# Patient Record
Sex: Female | Born: 1960 | Race: White | Hispanic: No | Marital: Married | Smoking: Never smoker
Health system: Southern US, Community
[De-identification: ages and names within clinical notes are randomized; demographics above are authoritative.]

## PROBLEM LIST (undated history)

## (undated) DIAGNOSIS — F32A Depression, unspecified: Secondary | ICD-10-CM

## (undated) DIAGNOSIS — J45909 Unspecified asthma, uncomplicated: Secondary | ICD-10-CM

## (undated) DIAGNOSIS — N189 Chronic kidney disease, unspecified: Secondary | ICD-10-CM

## (undated) DIAGNOSIS — F419 Anxiety disorder, unspecified: Secondary | ICD-10-CM

## (undated) DIAGNOSIS — T4145XA Adverse effect of unspecified anesthetic, initial encounter: Secondary | ICD-10-CM

## (undated) DIAGNOSIS — T8859XA Other complications of anesthesia, initial encounter: Secondary | ICD-10-CM

## (undated) DIAGNOSIS — K219 Gastro-esophageal reflux disease without esophagitis: Secondary | ICD-10-CM

## (undated) DIAGNOSIS — I10 Essential (primary) hypertension: Secondary | ICD-10-CM

## (undated) DIAGNOSIS — Z973 Presence of spectacles and contact lenses: Secondary | ICD-10-CM

## (undated) DIAGNOSIS — F329 Major depressive disorder, single episode, unspecified: Secondary | ICD-10-CM

## (undated) HISTORY — PX: WISDOM TOOTH EXTRACTION: SHX21

## (undated) HISTORY — DX: Chronic kidney disease, unspecified: N18.9

## (undated) HISTORY — DX: Essential (primary) hypertension: I10

## (undated) HISTORY — DX: Unspecified asthma, uncomplicated: J45.909

---

## 1993-04-30 HISTORY — PX: VAGINAL HYSTERECTOMY: SUR661

## 1999-07-20 ENCOUNTER — Encounter: Payer: Self-pay | Admitting: Otolaryngology

## 1999-07-20 ENCOUNTER — Encounter: Admission: RE | Admit: 1999-07-20 | Discharge: 1999-07-20 | Payer: Self-pay | Admitting: Otolaryngology

## 2002-02-03 ENCOUNTER — Encounter: Admission: RE | Admit: 2002-02-03 | Discharge: 2002-02-03 | Payer: Self-pay | Admitting: Family Medicine

## 2002-02-03 ENCOUNTER — Encounter: Payer: Self-pay | Admitting: Family Medicine

## 2013-09-24 ENCOUNTER — Other Ambulatory Visit: Payer: Self-pay

## 2013-09-24 DIAGNOSIS — Z1231 Encounter for screening mammogram for malignant neoplasm of breast: Secondary | ICD-10-CM

## 2013-10-01 ENCOUNTER — Ambulatory Visit
Admission: RE | Admit: 2013-10-01 | Discharge: 2013-10-01 | Disposition: A | Discharge status: Home / Self Care | Payer: No Typology Code available for payment source | Source: Ambulatory Visit

## 2013-10-01 ENCOUNTER — Encounter (INDEPENDENT_AMBULATORY_CARE_PROVIDER_SITE_OTHER): Payer: Self-pay

## 2013-10-01 DIAGNOSIS — Z1231 Encounter for screening mammogram for malignant neoplasm of breast: Secondary | ICD-10-CM

## 2013-10-05 ENCOUNTER — Other Ambulatory Visit: Payer: Self-pay | Admitting: Family Medicine

## 2013-10-05 DIAGNOSIS — R928 Other abnormal and inconclusive findings on diagnostic imaging of breast: Secondary | ICD-10-CM

## 2013-10-07 ENCOUNTER — Ambulatory Visit
Admission: RE | Admit: 2013-10-07 | Discharge: 2013-10-07 | Disposition: A | Discharge status: Home / Self Care | Payer: No Typology Code available for payment source | Source: Ambulatory Visit | Attending: Family Medicine | Admitting: Family Medicine

## 2013-10-07 ENCOUNTER — Other Ambulatory Visit: Payer: Self-pay | Admitting: Family Medicine

## 2013-10-07 DIAGNOSIS — R921 Mammographic calcification found on diagnostic imaging of breast: Secondary | ICD-10-CM

## 2013-10-07 DIAGNOSIS — R928 Other abnormal and inconclusive findings on diagnostic imaging of breast: Secondary | ICD-10-CM

## 2013-10-15 ENCOUNTER — Ambulatory Visit
Admission: RE | Admit: 2013-10-15 | Discharge: 2013-10-15 | Disposition: A | Discharge status: Home / Self Care | Payer: No Typology Code available for payment source | Source: Ambulatory Visit | Attending: Family Medicine | Admitting: Family Medicine

## 2013-10-26 ENCOUNTER — Ambulatory Visit (INDEPENDENT_AMBULATORY_CARE_PROVIDER_SITE_OTHER): Payer: No Typology Code available for payment source | Admitting: General Surgery

## 2013-10-26 ENCOUNTER — Encounter (INDEPENDENT_AMBULATORY_CARE_PROVIDER_SITE_OTHER): Payer: Self-pay | Admitting: General Surgery

## 2013-10-26 VITALS — BP 128/86 | HR 66 | Temp 97.0°F | Ht 66.0 in | Wt 228.0 lb

## 2013-10-26 DIAGNOSIS — N6089 Other benign mammary dysplasias of unspecified breast: Secondary | ICD-10-CM

## 2013-10-26 DIAGNOSIS — N6091 Unspecified benign mammary dysplasia of right breast: Secondary | ICD-10-CM | POA: Insufficient documentation

## 2013-10-26 NOTE — Progress Notes (Signed)
Patient ID: Linda Davidson, female   DOB: July 15, 1960, 53 y.o.   MRN: [id number]  Chief Complaint  Patient presents with  . eval ADH    HPI Linda Davidson is a 53 y.o. female.  We are asked to see the patient in consultation by Dr. Radford Pax to evaluate her for right breast atypical duct hyperplasia. The patient is a 53 year old white female who recently went for a routine screening mammogram. At that time she was found to have a 4 mm area of microcalcification in the lateral right breast. She denies any breast pain. She denies any discharge from her nipple. This was biopsied and came back as atypical duct hyperplasia.  HPI  Past Medical History  Diagnosis Date  . Asthma   . Hypertension   . Chronic kidney disease     Past Surgical History  Procedure Laterality Date  . Abdominal hysterectomy      Family History  Problem Relation Age of Onset  . Diabetes Mother   . Heart disease Mother   . Heart disease Father     Social History History  Substance Use Topics  . Smoking status: Never Smoker   . Smokeless tobacco: Not on file  . Alcohol Use: Yes    Allergies  Allergen Reactions  . Augmentin [Amoxicillin-Pot Clavulanate]     Current Outpatient Prescriptions  Medication Sig Dispense Refill  . ABILIFY 15 MG tablet       . atenolol (TENORMIN) 25 MG tablet       . buPROPion (WELLBUTRIN XL) 300 MG 24 hr tablet       . fluticasone (FLONASE) 50 MCG/ACT nasal spray       . hydrochlorothiazide (HYDRODIURIL) 25 MG tablet       . omeprazole (PRILOSEC) 20 MG capsule       . potassium chloride (MICRO-K) 10 MEQ CR capsule       . valsartan (DIOVAN) 320 MG tablet       . VIIBRYD 20 MG TABS        No current facility-administered medications for this visit.    Review of Systems Review of Systems  Constitutional: Negative.   HENT: Negative.   Eyes: Negative.   Respiratory: Negative.   Cardiovascular: Negative.   Gastrointestinal: Negative.   Endocrine: Negative.   Genitourinary:  Negative.   Musculoskeletal: Negative.   Skin: Negative.   Allergic/Immunologic: Negative.   Neurological: Negative.   Hematological: Negative.   Psychiatric/Behavioral: Negative.     Blood pressure 128/86, pulse 66, temperature 97 F (36.1 C), height 5\' 6"  (1.676 m), weight 228 lb (103.42 kg).  Physical Exam Physical Exam  Constitutional: She is oriented to person, place, and time. She appears well-developed and well-nourished.  HENT:  Head: Normocephalic and atraumatic.  Eyes: Conjunctivae and EOM are normal. Pupils are equal, round, and reactive to light.  Neck: Normal range of motion. Neck supple.  Cardiovascular: Normal rate, regular rhythm and normal heart sounds.   Pulmonary/Chest: Effort normal and breath sounds normal.  There is no palpable mass in either breast. There is no palpable axillary, supraclavicular, or cervical lymphadenopathy  Abdominal: Soft. Bowel sounds are normal.  Musculoskeletal: Normal range of motion.  Lymphadenopathy:    She has no cervical adenopathy.  Neurological: She is alert and oriented to person, place, and time.  Skin: Skin is warm and dry.  Psychiatric: She has a normal mood and affect. Her behavior is normal.    Data Reviewed As above  Assessment  The patient appears to have a small area of atypical duct hyperplasia in the lateral right breast. Because this is considered a high risk lesion and because it has an appearance very similar to ductal carcinoma in situ at think she would benefit from having this area removed. I've discussed with her in detail the risks and benefits of the operation as well as some of the technical aspects and she understands and wishes to proceed     Plan    Plan for right breast radioactive seed localized lumpectomy. I've also talked her about this being a high-risk lesion and offered to send her to a high risk clinic at one of the academic centers. She is going to think about this.        TOTH  III,PAUL S 10/26/2013, 9:13 AM

## 2013-10-26 NOTE — Patient Instructions (Signed)
Plan for right breast lumpectomy

## 2013-10-27 ENCOUNTER — Other Ambulatory Visit (INDEPENDENT_AMBULATORY_CARE_PROVIDER_SITE_OTHER): Payer: Self-pay | Admitting: General Surgery

## 2013-10-27 DIAGNOSIS — N6091 Unspecified benign mammary dysplasia of right breast: Secondary | ICD-10-CM

## 2013-11-20 ENCOUNTER — Encounter (HOSPITAL_BASED_OUTPATIENT_CLINIC_OR_DEPARTMENT_OTHER): Payer: Self-pay | Admitting: *Deleted

## 2013-11-20 NOTE — Progress Notes (Signed)
To come in for [id number] 

## 2013-11-20 NOTE — Progress Notes (Signed)
11/20/13 1016  OBSTRUCTIVE SLEEP APNEA  Have you ever been diagnosed with sleep apnea through a sleep study? No  Do you snore loudly (loud enough to be heard through closed doors)?  1  Do you often feel tired, fatigued, or sleepy during the daytime? 0  Has anyone observed you stop breathing during your sleep? 0  Do you have, or are you being treated for high blood pressure? 1  BMI more than 35 kg/m2? 1  Age over 53 years old? 1  Neck circumference greater than 40 cm/16 inches? 1  Gender: 0  Obstructive Sleep Apnea Score 5

## 2013-11-23 ENCOUNTER — Encounter (HOSPITAL_BASED_OUTPATIENT_CLINIC_OR_DEPARTMENT_OTHER): Payer: Self-pay | Admitting: *Deleted

## 2013-11-26 ENCOUNTER — Encounter (INDEPENDENT_AMBULATORY_CARE_PROVIDER_SITE_OTHER): Payer: Self-pay

## 2013-11-26 ENCOUNTER — Ambulatory Visit
Admission: RE | Admit: 2013-11-26 | Discharge: 2013-11-26 | Disposition: A | Discharge status: Home / Self Care | Payer: No Typology Code available for payment source | Source: Ambulatory Visit | Attending: General Surgery | Admitting: General Surgery

## 2013-11-26 ENCOUNTER — Encounter (HOSPITAL_BASED_OUTPATIENT_CLINIC_OR_DEPARTMENT_OTHER)
Admission: RE | Admit: 2013-11-26 | Discharge: 2013-11-26 | Disposition: A | Discharge status: Home / Self Care | Payer: No Typology Code available for payment source | Source: Ambulatory Visit | Attending: General Surgery | Admitting: General Surgery

## 2013-11-26 DIAGNOSIS — N6091 Unspecified benign mammary dysplasia of right breast: Secondary | ICD-10-CM

## 2013-11-26 LAB — BASIC METABOLIC PANEL
Anion gap: 11 (ref 5–15)
BUN: 23 mg/dL (ref 6–23)
CALCIUM: 9.7 mg/dL (ref 8.4–10.5)
CO2: 31 mEq/L (ref 19–32)
Chloride: 104 mEq/L (ref 96–112)
Creatinine, Ser: 1.24 mg/dL — ABNORMAL HIGH (ref 0.50–1.10)
GFR, EST AFRICAN AMERICAN: 56 mL/min — AB (ref >90)
GFR, EST NON AFRICAN AMERICAN: 49 mL/min — AB (ref >90)
GLUCOSE: 96 mg/dL (ref 70–99)
POTASSIUM: 4.2 meq/L (ref 3.7–5.3)
SODIUM: 146 meq/L (ref 137–147)

## 2013-11-27 ENCOUNTER — Encounter (HOSPITAL_BASED_OUTPATIENT_CLINIC_OR_DEPARTMENT_OTHER)
Admission: RE | Disposition: A | Discharge status: Home / Self Care | Payer: Self-pay | Source: Ambulatory Visit | Attending: General Surgery

## 2013-11-27 ENCOUNTER — Encounter (HOSPITAL_BASED_OUTPATIENT_CLINIC_OR_DEPARTMENT_OTHER): Payer: Self-pay | Admitting: *Deleted

## 2013-11-27 ENCOUNTER — Ambulatory Visit (HOSPITAL_BASED_OUTPATIENT_CLINIC_OR_DEPARTMENT_OTHER): Payer: No Typology Code available for payment source | Admitting: Certified Registered"

## 2013-11-27 ENCOUNTER — Ambulatory Visit
Admission: RE | Admit: 2013-11-27 | Discharge: 2013-11-27 | Disposition: A | Discharge status: Home / Self Care | Payer: No Typology Code available for payment source | Source: Ambulatory Visit | Attending: General Surgery | Admitting: General Surgery

## 2013-11-27 ENCOUNTER — Encounter (HOSPITAL_BASED_OUTPATIENT_CLINIC_OR_DEPARTMENT_OTHER): Payer: No Typology Code available for payment source | Admitting: Certified Registered"

## 2013-11-27 ENCOUNTER — Ambulatory Visit (HOSPITAL_BASED_OUTPATIENT_CLINIC_OR_DEPARTMENT_OTHER)
Admission: RE | Admit: 2013-11-27 | Discharge: 2013-11-27 | Disposition: A | Discharge status: Home / Self Care | Payer: No Typology Code available for payment source | Source: Ambulatory Visit | Attending: General Surgery | Admitting: General Surgery

## 2013-11-27 DIAGNOSIS — I129 Hypertensive chronic kidney disease with stage 1 through stage 4 chronic kidney disease, or unspecified chronic kidney disease: Secondary | ICD-10-CM | POA: Insufficient documentation

## 2013-11-27 DIAGNOSIS — F3289 Other specified depressive episodes: Secondary | ICD-10-CM | POA: Insufficient documentation

## 2013-11-27 DIAGNOSIS — N6091 Unspecified benign mammary dysplasia of right breast: Secondary | ICD-10-CM

## 2013-11-27 DIAGNOSIS — Z90710 Acquired absence of both cervix and uterus: Secondary | ICD-10-CM | POA: Insufficient documentation

## 2013-11-27 DIAGNOSIS — K219 Gastro-esophageal reflux disease without esophagitis: Secondary | ICD-10-CM | POA: Insufficient documentation

## 2013-11-27 DIAGNOSIS — N6089 Other benign mammary dysplasias of unspecified breast: Secondary | ICD-10-CM | POA: Insufficient documentation

## 2013-11-27 DIAGNOSIS — R920 Mammographic microcalcification found on diagnostic imaging of breast: Secondary | ICD-10-CM

## 2013-11-27 DIAGNOSIS — F411 Generalized anxiety disorder: Secondary | ICD-10-CM | POA: Insufficient documentation

## 2013-11-27 DIAGNOSIS — N6019 Diffuse cystic mastopathy of unspecified breast: Secondary | ICD-10-CM

## 2013-11-27 DIAGNOSIS — Z01812 Encounter for preprocedural laboratory examination: Secondary | ICD-10-CM | POA: Insufficient documentation

## 2013-11-27 DIAGNOSIS — D4860 Neoplasm of uncertain behavior of unspecified breast: Secondary | ICD-10-CM

## 2013-11-27 DIAGNOSIS — J45909 Unspecified asthma, uncomplicated: Secondary | ICD-10-CM | POA: Insufficient documentation

## 2013-11-27 DIAGNOSIS — F329 Major depressive disorder, single episode, unspecified: Secondary | ICD-10-CM | POA: Insufficient documentation

## 2013-11-27 DIAGNOSIS — N189 Chronic kidney disease, unspecified: Secondary | ICD-10-CM | POA: Insufficient documentation

## 2013-11-27 HISTORY — DX: Anxiety disorder, unspecified: F41.9

## 2013-11-27 HISTORY — DX: Presence of spectacles and contact lenses: Z97.3

## 2013-11-27 HISTORY — DX: Gastro-esophageal reflux disease without esophagitis: K21.9

## 2013-11-27 HISTORY — DX: Adverse effect of unspecified anesthetic, initial encounter: T41.45XA

## 2013-11-27 HISTORY — DX: Major depressive disorder, single episode, unspecified: F32.9

## 2013-11-27 HISTORY — DX: Depression, unspecified: F32.A

## 2013-11-27 HISTORY — DX: Other complications of anesthesia, initial encounter: T88.59XA

## 2013-11-27 HISTORY — PX: BREAST LUMPECTOMY WITH RADIOACTIVE SEED LOCALIZATION: SHX6424

## 2013-11-27 LAB — POCT HEMOGLOBIN-HEMACUE: HEMOGLOBIN: 13.6 g/dL (ref 12.0–15.0)

## 2013-11-27 SURGERY — BREAST LUMPECTOMY WITH RADIOACTIVE SEED LOCALIZATION
Anesthesia: General | Site: Breast | Laterality: Right

## 2013-11-27 MED ORDER — LACTATED RINGERS IV SOLN
INTRAVENOUS | Status: DC
  Administered 2013-11-27: 07:00:00 via INTRAVENOUS

## 2013-11-27 MED ORDER — OXYCODONE HCL 5 MG PO TABS: 5.0000 mg | ORAL_TABLET | Freq: Once | ORAL | Status: DC | PRN

## 2013-11-27 MED ORDER — FENTANYL CITRATE 0.05 MG/ML IJ SOLN: 50.0000 ug | INTRAMUSCULAR | Status: DC | PRN

## 2013-11-27 MED ORDER — ONDANSETRON HCL 4 MG/2ML IJ SOLN
4.0000 mg | Freq: Once | INTRAMUSCULAR | Status: DC | PRN
Start: 2013-11-27 — End: 2013-11-27

## 2013-11-27 MED ORDER — MIDAZOLAM HCL 5 MG/5ML IJ SOLN
INTRAMUSCULAR | Status: DC | PRN
  Administered 2013-11-27: 2 mg via INTRAVENOUS

## 2013-11-27 MED ORDER — VANCOMYCIN HCL IN DEXTROSE 500-5 MG/100ML-% IV SOLN
INTRAVENOUS | Status: AC
  Filled 2013-11-27: qty 100

## 2013-11-27 MED ORDER — VANCOMYCIN HCL IN DEXTROSE 1-5 GM/200ML-% IV SOLN
INTRAVENOUS | Status: AC
  Filled 2013-11-27: qty 200

## 2013-11-27 MED ORDER — EPHEDRINE SULFATE 50 MG/ML IJ SOLN
INTRAMUSCULAR | Status: DC | PRN
  Administered 2013-11-27: 10 mg via INTRAVENOUS

## 2013-11-27 MED ORDER — MIDAZOLAM HCL 2 MG/2ML IJ SOLN
INTRAMUSCULAR | Status: AC
  Filled 2013-11-27: qty 2

## 2013-11-27 MED ORDER — OXYCODONE-ACETAMINOPHEN 5-325 MG PO TABS: 1.0000 | ORAL_TABLET | ORAL | Status: AC | PRN

## 2013-11-27 MED ORDER — MIDAZOLAM HCL 2 MG/2ML IJ SOLN: 1.0000 mg | INTRAMUSCULAR | Status: DC | PRN

## 2013-11-27 MED ORDER — OXYCODONE HCL 5 MG/5ML PO SOLN: 5.0000 mg | Freq: Once | ORAL | Status: DC | PRN

## 2013-11-27 MED ORDER — ONDANSETRON HCL 4 MG/2ML IJ SOLN
INTRAMUSCULAR | Status: DC | PRN
  Administered 2013-11-27: 4 mg via INTRAVENOUS

## 2013-11-27 MED ORDER — CHLORHEXIDINE GLUCONATE 4 % EX LIQD: 1.0000 "application " | Freq: Once | CUTANEOUS | Status: DC

## 2013-11-27 MED ORDER — FENTANYL CITRATE 0.05 MG/ML IJ SOLN
INTRAMUSCULAR | Status: DC | PRN
  Administered 2013-11-27: 50 ug via INTRAVENOUS

## 2013-11-27 MED ORDER — PROPOFOL 10 MG/ML IV EMUL
INTRAVENOUS | Status: AC
  Filled 2013-11-27: qty 50

## 2013-11-27 MED ORDER — BUPIVACAINE HCL (PF) 0.25 % IJ SOLN
INTRAMUSCULAR | Status: DC | PRN
  Administered 2013-11-27: 20 mL

## 2013-11-27 MED ORDER — BUPIVACAINE-EPINEPHRINE (PF) 0.25% -1:200000 IJ SOLN
INTRAMUSCULAR | Status: AC
  Filled 2013-11-27: qty 30

## 2013-11-27 MED ORDER — HYDROMORPHONE HCL PF 1 MG/ML IJ SOLN: 0.2500 mg | INTRAMUSCULAR | Status: DC | PRN

## 2013-11-27 MED ORDER — PROPOFOL 10 MG/ML IV BOLUS
INTRAVENOUS | Status: DC | PRN
  Administered 2013-11-27: 150 mg via INTRAVENOUS

## 2013-11-27 MED ORDER — FENTANYL CITRATE 0.05 MG/ML IJ SOLN
INTRAMUSCULAR | Status: AC
  Filled 2013-11-27: qty 6

## 2013-11-27 MED ORDER — VANCOMYCIN HCL 10 G IV SOLR
1500.0000 mg | INTRAVENOUS | Status: AC
  Administered 2013-11-27: 1000 mg via INTRAVENOUS

## 2013-11-27 MED ORDER — LIDOCAINE HCL (CARDIAC) 20 MG/ML IV SOLN
INTRAVENOUS | Status: DC | PRN
  Administered 2013-11-27: 60 mg via INTRAVENOUS

## 2013-11-27 MED ORDER — DEXAMETHASONE SODIUM PHOSPHATE 4 MG/ML IJ SOLN
INTRAMUSCULAR | Status: DC | PRN
  Administered 2013-11-27: 10 mg via INTRAVENOUS

## 2013-11-27 MED ORDER — BUPIVACAINE HCL (PF) 0.25 % IJ SOLN
INTRAMUSCULAR | Status: AC
  Filled 2013-11-27: qty 30

## 2013-11-27 SURGICAL SUPPLY — 52 items
ADH SKN CLS APL DERMABOND .7 (GAUZE/BANDAGES/DRESSINGS) ×1
APPLIER CLIP 9.375 MED OPEN (MISCELLANEOUS)
APR CLP MED 9.3 20 MLT OPN (MISCELLANEOUS)
BINDER BREAST LRG (GAUZE/BANDAGES/DRESSINGS) IMPLANT
BINDER BREAST MEDIUM (GAUZE/BANDAGES/DRESSINGS) IMPLANT
BINDER BREAST XLRG (GAUZE/BANDAGES/DRESSINGS) IMPLANT
BINDER BREAST XXLRG (GAUZE/BANDAGES/DRESSINGS) IMPLANT
BLADE SURG 15 STRL LF DISP TIS (BLADE) ×1 IMPLANT
BLADE SURG 15 STRL SS (BLADE) ×3
CANISTER SUC SOCK COL 7IN (MISCELLANEOUS) IMPLANT
CANISTER SUCT 1200ML W/VALVE (MISCELLANEOUS) ×3 IMPLANT
CHLORAPREP W/TINT 26ML (MISCELLANEOUS) ×3 IMPLANT
CLIP APPLIE 9.375 MED OPEN (MISCELLANEOUS) IMPLANT
CLIP TI WIDE RED SMALL 6 (CLIP) ×3 IMPLANT
COVER MAYO STAND STRL (DRAPES) ×3 IMPLANT
COVER PROBE W GEL 5X96 (DRAPES) ×3 IMPLANT
COVER TABLE BACK 60X90 (DRAPES) ×3 IMPLANT
DECANTER SPIKE VIAL GLASS SM (MISCELLANEOUS) IMPLANT
DERMABOND ADVANCED (GAUZE/BANDAGES/DRESSINGS) ×2
DERMABOND ADVANCED .7 DNX12 (GAUZE/BANDAGES/DRESSINGS) ×1 IMPLANT
DEVICE DUBIN W/COMP PLATE 8390 (MISCELLANEOUS) ×3 IMPLANT
DRAPE LAPAROSCOPIC ABDOMINAL (DRAPES) ×3 IMPLANT
DRAPE PED LAPAROTOMY (DRAPES) IMPLANT
DRAPE UTILITY XL STRL (DRAPES) ×3 IMPLANT
ELECT COATED BLADE 2.86 ST (ELECTRODE) ×3 IMPLANT
ELECT REM PT RETURN 9FT ADLT (ELECTROSURGICAL) ×3
ELECTRODE REM PT RTRN 9FT ADLT (ELECTROSURGICAL) ×1 IMPLANT
GLOVE BIO SURGEON STRL SZ7.5 (GLOVE) ×3 IMPLANT
GLOVE BIOGEL PI IND STRL 7.0 (GLOVE) ×2 IMPLANT
GLOVE BIOGEL PI INDICATOR 7.0 (GLOVE) ×4
GLOVE ECLIPSE 7.0 STRL STRAW (GLOVE) ×3 IMPLANT
GLOVE EUDERMIC 7 POWDERFREE (GLOVE) ×3 IMPLANT
GOWN STRL REUS W/ TWL LRG LVL3 (GOWN DISPOSABLE) ×2 IMPLANT
GOWN STRL REUS W/ TWL XL LVL3 (GOWN DISPOSABLE) ×1 IMPLANT
GOWN STRL REUS W/TWL LRG LVL3 (GOWN DISPOSABLE) ×4
GOWN STRL REUS W/TWL XL LVL3 (GOWN DISPOSABLE) ×2
KIT MARKER MARGIN INK (KITS) ×3 IMPLANT
NEEDLE HYPO 25X1 1.5 SAFETY (NEEDLE) ×3 IMPLANT
NS IRRIG 1000ML POUR BTL (IV SOLUTION) ×3 IMPLANT
PACK BASIN DAY SURGERY FS (CUSTOM PROCEDURE TRAY) ×3 IMPLANT
PENCIL BUTTON HOLSTER BLD 10FT (ELECTRODE) ×3 IMPLANT
SLEEVE SCD COMPRESS KNEE MED (MISCELLANEOUS) ×3 IMPLANT
SPONGE LAP 4X18 X RAY DECT (DISPOSABLE) ×3 IMPLANT
SUT MON AB 4-0 PC3 18 (SUTURE) ×3 IMPLANT
SUT SILK 2 0 SH (SUTURE) ×3 IMPLANT
SUT VICRYL 3-0 CR8 SH (SUTURE) ×3 IMPLANT
SYRINGE CONTROL L 12CC (SYRINGE) ×3 IMPLANT
TOWEL OR 17X24 6PK STRL BLUE (TOWEL DISPOSABLE) ×3 IMPLANT
TOWEL OR NON WOVEN STRL DISP B (DISPOSABLE) ×3 IMPLANT
TUBE CONNECTING 20'X1/4 (TUBING) ×1
TUBE CONNECTING 20X1/4 (TUBING) ×2 IMPLANT
YANKAUER SUCT BULB TIP NO VENT (SUCTIONS) ×3 IMPLANT

## 2013-11-27 NOTE — Discharge Instructions (Signed)

## 2013-11-27 NOTE — Transfer of Care (Signed)
Immediate Anesthesia Transfer of Care Note  Patient: Linda Davidson  Procedure(s) Performed: Procedure(s): RIGHT BREAST  RADIOACTIVE SEED LOCALIZATION LUMPECTOMY (Right)  Patient Location: PACU  Anesthesia Type:General  Level of Consciousness: awake, alert , oriented and patient cooperative  Airway & Oxygen Therapy: Patient Spontanous Breathing and Patient connected to face mask oxygen  Post-op Assessment: Report given to PACU RN and Post -op Vital signs reviewed and stable  Post vital signs: Reviewed and stable  Complications: No apparent anesthesia complications

## 2013-11-27 NOTE — Interval H&P Note (Signed)
History and Physical Interval Note:  11/27/2013 7:17 AM  Linda Davidson  has presented today for surgery, with the diagnosis of right breast ADH  The various methods of treatment have been discussed with the patient and family. After consideration of risks, benefits and other options for treatment, the patient has consented to  Procedure(s): RIGHT BREAST  RADIOACTIVE SEED LOCALIZATION LUMPECTOMY (Right) as a surgical intervention .  The patient's history has been reviewed, patient examined, no change in status, stable for surgery.  I have reviewed the patient's chart and labs.  Questions were answered to the patient's satisfaction.     TOTH III,PAUL S

## 2013-11-27 NOTE — Anesthesia Postprocedure Evaluation (Signed)
  Anesthesia Post-op Note  Patient: Linda Davidson  Procedure(s) Performed: Procedure(s): RIGHT BREAST  RADIOACTIVE SEED LOCALIZATION LUMPECTOMY (Right)  Patient Location: PACU  Anesthesia Type: General   Level of Consciousness: awake, alert  and oriented  Airway and Oxygen Therapy: Patient Spontanous Breathing  Post-op Pain: moderate  Post-op Assessment: Post-op Vital signs reviewed  Post-op Vital Signs: Reviewed  Last Vitals:  Filed Vitals:   11/27/13 0915  BP: 127/81  Pulse: 85  Temp:   Resp: 15    Complications: No apparent anesthesia complications

## 2013-11-27 NOTE — Anesthesia Preprocedure Evaluation (Addendum)
Anesthesia Evaluation  Patient identified by MRN, date of birth, ID band Patient awake    Reviewed: Allergy & Precautions, NPO status   Airway Mallampati: I TM Distance: >3 FB Neck ROM: Full    Dental  (+) Teeth Intact, Dental Advisory Given   Pulmonary asthma ,  breath sounds clear to auscultation        Cardiovascular hypertension, Pt. on home beta blockers Rhythm:Regular Rate:Normal     Neuro/Psych PSYCHIATRIC DISORDERS Anxiety Depression    GI/Hepatic GERD-  Medicated,  Endo/Other    Renal/GU Renal InsufficiencyRenal disease     Musculoskeletal   Abdominal   Peds  Hematology   Anesthesia Other Findings   Reproductive/Obstetrics                        Anesthesia Physical Anesthesia Plan  ASA: III  Anesthesia Plan: General   Post-op Pain Management:    Induction: Intravenous  Airway Management Planned: LMA  Additional Equipment:   Intra-op Plan:   Post-operative Plan: Extubation in OR  Informed Consent: I have reviewed the patients History and Physical, chart, labs and discussed the procedure including the risks, benefits and alternatives for the proposed anesthesia with the patient or authorized representative who has indicated his/her understanding and acceptance.   Dental advisory given  Plan Discussed with: CRNA, Anesthesiologist and Surgeon  Anesthesia Plan Comments:         Anesthesia Quick Evaluation

## 2013-11-27 NOTE — Op Note (Signed)
11/27/2013  8:24 AM  PATIENT:  Linda Davidson  53 y.o. female  PRE-OPERATIVE DIAGNOSIS:  right breast ADH  POST-OPERATIVE DIAGNOSIS:  right breast atypical ductal hyperplasia  PROCEDURE:  Procedure(s): RIGHT BREAST  RADIOACTIVE SEED LOCALIZATION LUMPECTOMY (Right)  SURGEON:  Surgeon(s) and Role:    * Merrie Roof, MD - Primary    * Adin Hector, MD - Assisting  PHYSICIAN ASSISTANT:   ASSISTANTS: Dr. Dalbert Batman   ANESTHESIA:   general  EBL:     BLOOD ADMINISTERED:none  DRAINS: none   LOCAL MEDICATIONS USED:  MARCAINE     SPECIMEN:  Source of Specimen:  right breast tissue  DISPOSITION OF SPECIMEN:  PATHOLOGY  COUNTS:  YES  TOURNIQUET:  * No tourniquets in log *  DICTATION: .Dragon Dictation After informed consent was obtained the patient was brought to the operating room and placed in the supine position on the operating room table. After adequate induction of general anesthesia the patient's right breast was prepped with ChloraPrep, allowed to dry, and draped in the usual sterile manner. The patient had previously had a radioactive seed placed in the upper outer quadrant of the right breast. Using the I 125 setting on the neoprobe we were able to identify a hot spot in the upper outer quadrant near the edge of the areola. A curvilinear incision was made at the edge of the areola in the upper outer quadrant. This incision was carried through the skin and subcutaneous tissue sharply with the electrocautery. I checked our direction frequently with the neoprobe. Once we were close to the radioactive seed we then excised a circular portion of breast tissue around the area of radioactivity. Once the specimen was removed it was oriented with the appropriate paint colors. The I-125 signal was confirmed in the specimen. There was no radioactivity in the breast. A specimen radiograph showed the clip in seed to both be in the specimen. The specimen was then sent to pathology for further  evaluation. Hemostasis was achieved using the Bovie electrocautery. The wound was infiltrated with quarter percent Marcaine and irrigated with copious amounts of saline. The deep layer the wounds closed with interrupted 3-0 vicryl stitches. The skin was then closed with interrupted 4-0 Monocryl subcuticular stitches. Dermabond dressings were applied. The patient tolerated the procedure well. At the end of the case all needle sponge and instrument counts were correct. The patient was then awakened and taken to recovery in stable condition.  PLAN OF CARE: Discharge to home after PACU  PATIENT DISPOSITION:  PACU - hemodynamically stable.   Delay start of Pharmacological VTE agent (>24hrs) due to surgical blood loss or risk of bleeding: not applicable

## 2013-11-27 NOTE — H&P (Signed)
Linda Davidson  10/26/2013 8:30 AM   Office Visit  MRN:  [id number]   Description: 53 year old female  Provider: Merrie Roof, MD  Department: [facility]         Diagnoses      Atypical ductal hyperplasia of right breast    -  Primary      610.8             Reason for Visit      eval ADH             Current Vitals Most recent update: 10/26/2013  8:40 AM by Vale Haven, CMA      BP Pulse Temp(Src) Ht Wt BMI      128/86 66 97 F (36.1 C) 5\' 6"  (1.676 m) 228 lb (103.42 kg) 36.82 kg/m2             Progress Notes      Merrie Roof, MD at 10/26/2013  9:13 AM      Status: Signed            Patient ID: Cleotis Lema, female   DOB: 06/26/1960, 53 y.o.   MRN: [id number]    Chief Complaint   Patient presents with   .  eval ADH        HPI Linda Davidson is a 53 y.o. female.  We are asked to see the patient in consultation by Dr. Radford Pax to evaluate her for right breast atypical duct hyperplasia. The patient is a 53 year old white female who recently went for a routine screening mammogram. At that time she was found to have a 4 mm area of microcalcification in the lateral right breast. She denies any breast pain. She denies any discharge from her nipple. This was biopsied and came back as atypical duct hyperplasia.  HPI    Past Medical History   Diagnosis  Date   .  Asthma     .  Hypertension     .  Chronic kidney disease           Past Surgical History   Procedure  Laterality  Date   .  Abdominal hysterectomy             Family History   Problem  Relation  Age of Onset   .  Diabetes  Mother     .  Heart disease  Mother     .  Heart disease  Father          Social History History   Substance Use Topics   .  Smoking status:  Never Smoker    .  Smokeless tobacco:  Not on file   .  Alcohol Use:  Yes         Allergies   Allergen  Reactions   .  Augmentin [Amoxicillin-Pot Clavulanate]           Current Outpatient Prescriptions   Medication   Sig  Dispense  Refill   .  ABILIFY 15 MG tablet           .  atenolol (TENORMIN) 25 MG tablet           .  buPROPion (WELLBUTRIN XL) 300 MG 24 hr tablet           .  fluticasone (FLONASE) 50 MCG/ACT nasal spray           .  hydrochlorothiazide (HYDRODIURIL) 25 MG tablet           .  omeprazole (PRILOSEC) 20 MG capsule           .  potassium chloride (MICRO-K) 10 MEQ CR capsule           .  valsartan (DIOVAN) 320 MG tablet           .  VIIBRYD 20 MG TABS               No current facility-administered medications for this visit.        Review of Systems Review of Systems  Constitutional: Negative.   HENT: Negative.   Eyes: Negative.   Respiratory: Negative.   Cardiovascular: Negative.   Gastrointestinal: Negative.   Endocrine: Negative.   Genitourinary: Negative.   Musculoskeletal: Negative.   Skin: Negative.   Allergic/Immunologic: Negative.   Neurological: Negative.   Hematological: Negative.   Psychiatric/Behavioral: Negative.       Blood pressure 128/86, pulse 66, temperature 97 F (36.1 C), height 5\' 6"  (1.676 m), weight 228 lb (103.42 kg).   Physical Exam Physical Exam  Constitutional: She is oriented to person, place, and time. She appears well-developed and well-nourished.  HENT:   Head: Normocephalic and atraumatic.  Eyes: Conjunctivae and EOM are normal. Pupils are equal, round, and reactive to light.  Neck: Normal range of motion. Neck supple.  Cardiovascular: Normal rate, regular rhythm and normal heart sounds.   Pulmonary/Chest: Effort normal and breath sounds normal.  There is no palpable mass in either breast. There is no palpable axillary, supraclavicular, or cervical lymphadenopathy  Abdominal: Soft. Bowel sounds are normal.  Musculoskeletal: Normal range of motion.  Lymphadenopathy:    She has no cervical adenopathy.  Neurological: She is alert and oriented to person, place, and time.  Skin: Skin is warm and dry.  Psychiatric: She has a normal  mood and affect. Her behavior is normal.      Data Reviewed As above   Assessment    The patient appears to have a small area of atypical duct hyperplasia in the lateral right breast. Because this is considered a high risk lesion and because it has an appearance very similar to ductal carcinoma in situ at think she would benefit from having this area removed. I've discussed with her in detail the risks and benefits of the operation as well as some of the technical aspects and she understands and wishes to proceed      Plan    Plan for right breast radioactive seed localized lumpectomy. I've also talked her about this being a high-risk lesion and offered to send her to a high risk clinic at one of the academic centers. She is going to think about this.

## 2013-11-27 NOTE — Anesthesia Procedure Notes (Signed)
Procedure Name: LMA Insertion Date/Time: 11/27/2013 7:34 AM Performed by: Caitlen Worth Pre-anesthesia Checklist: Patient identified, Emergency Drugs available, Suction available and Patient being monitored Patient Re-evaluated:Patient Re-evaluated prior to inductionOxygen Delivery Method: Circle System Utilized Preoxygenation: Pre-oxygenation with 100% oxygen Intubation Type: IV induction Ventilation: Mask ventilation without difficulty LMA: LMA inserted LMA Size: 4.0 Number of attempts: 1 Airway Equipment and Method: bite block Placement Confirmation: positive ETCO2 Tube secured with: Tape Dental Injury: Teeth and Oropharynx as per pre-operative assessment

## 2013-11-30 ENCOUNTER — Encounter (HOSPITAL_BASED_OUTPATIENT_CLINIC_OR_DEPARTMENT_OTHER): Payer: Self-pay | Admitting: General Surgery

## 2013-12-02 ENCOUNTER — Telehealth (INDEPENDENT_AMBULATORY_CARE_PROVIDER_SITE_OTHER): Payer: Self-pay

## 2013-12-02 NOTE — Telephone Encounter (Signed)
Pt requesting pathology results and was told all benign.  Dr. Marlou Starks will discuss with her at her post op appointment.

## 2013-12-15 ENCOUNTER — Telehealth: Payer: Self-pay | Admitting: *Deleted

## 2013-12-15 ENCOUNTER — Ambulatory Visit (INDEPENDENT_AMBULATORY_CARE_PROVIDER_SITE_OTHER): Payer: No Typology Code available for payment source | Admitting: General Surgery

## 2013-12-15 ENCOUNTER — Encounter (INDEPENDENT_AMBULATORY_CARE_PROVIDER_SITE_OTHER): Payer: Self-pay | Admitting: General Surgery

## 2013-12-15 VITALS — BP 124/74 | HR 78 | Temp 97.5°F | Ht 66.0 in | Wt 227.0 lb

## 2013-12-15 DIAGNOSIS — N6091 Unspecified benign mammary dysplasia of right breast: Secondary | ICD-10-CM

## 2013-12-15 DIAGNOSIS — N6089 Other benign mammary dysplasias of unspecified breast: Secondary | ICD-10-CM

## 2013-12-15 NOTE — Telephone Encounter (Signed)
Called pt and confirmed 12/22/13 high risk appt w/ her.  Emailed Sharyn Lull and [place] at [employer] to make them aware.

## 2013-12-15 NOTE — Patient Instructions (Signed)
Will refer to high risk clinic

## 2013-12-15 NOTE — Progress Notes (Signed)
Subjective:     Patient ID: Linda Davidson, female   DOB: 04-28-1961, 53 y.o.   MRN: [id number]  HPI The patient is a 53 year old white female who is about 2 weeks status post right lumpectomy for atypical duct and atypical lobular hyperplasia. She tolerated the surgery well. She denies any breast pain.  Review of Systems     Objective:   Physical Exam On exam her right breast incision is healing nicely with no sign of infection or significant seroma    Assessment:     The patient is 2 weeks status post right lumpectomy for a high-risk lesion     Plan:     At this point she will continue to do regular self exams. She will need to continue to get an annual exam and mammogram. I will also refer her to the high-risk clinic. We will plan to see her back on a when necessary basis

## 2013-12-21 ENCOUNTER — Other Ambulatory Visit: Payer: Self-pay | Admitting: *Deleted

## 2013-12-21 DIAGNOSIS — N6091 Unspecified benign mammary dysplasia of right breast: Secondary | ICD-10-CM

## 2013-12-22 ENCOUNTER — Ambulatory Visit (HOSPITAL_BASED_OUTPATIENT_CLINIC_OR_DEPARTMENT_OTHER): Payer: No Typology Code available for payment source | Admitting: Hematology

## 2013-12-22 ENCOUNTER — Other Ambulatory Visit (HOSPITAL_BASED_OUTPATIENT_CLINIC_OR_DEPARTMENT_OTHER): Payer: No Typology Code available for payment source

## 2013-12-22 VITALS — BP 130/81 | HR 63 | Temp 98.2°F | Resp 18 | Ht 66.0 in | Wt 229.5 lb

## 2013-12-22 DIAGNOSIS — N6089 Other benign mammary dysplasias of unspecified breast: Secondary | ICD-10-CM

## 2013-12-22 DIAGNOSIS — N62 Hypertrophy of breast: Secondary | ICD-10-CM

## 2013-12-22 DIAGNOSIS — N6091 Unspecified benign mammary dysplasia of right breast: Secondary | ICD-10-CM

## 2013-12-22 LAB — COMPREHENSIVE METABOLIC PANEL (CC13)
ALBUMIN: 3.9 g/dL (ref 3.5–5.0)
ALK PHOS: 80 U/L (ref 40–150)
ALT: 17 U/L (ref 0–55)
AST: 19 U/L (ref 5–34)
Anion Gap: 10 mEq/L (ref 3–11)
BILIRUBIN TOTAL: 0.38 mg/dL (ref 0.20–1.20)
BUN: 22.2 mg/dL (ref 7.0–26.0)
CO2: 30 mEq/L — ABNORMAL HIGH (ref 22–29)
Calcium: 9.6 mg/dL (ref 8.4–10.4)
Chloride: 106 mEq/L (ref 98–109)
Creatinine: 1.1 mg/dL (ref 0.6–1.1)
GLUCOSE: 100 mg/dL (ref 70–140)
POTASSIUM: 3.9 meq/L (ref 3.5–5.1)
SODIUM: 146 meq/L — AB (ref 136–145)
TOTAL PROTEIN: 7.2 g/dL (ref 6.4–8.3)

## 2013-12-22 LAB — CBC WITH DIFFERENTIAL/PLATELET
BASO%: 0.3 % (ref 0.0–2.0)
Basophils Absolute: 0 10*3/uL (ref 0.0–0.1)
EOS%: 1.2 % (ref 0.0–7.0)
Eosinophils Absolute: 0.1 10*3/uL (ref 0.0–0.5)
HCT: 42.9 % (ref 34.8–46.6)
HGB: 14.4 g/dL (ref 11.6–15.9)
LYMPH%: 36.8 % (ref 14.0–49.7)
MCH: 30.1 pg (ref 25.1–34.0)
MCHC: 33.5 g/dL (ref 31.5–36.0)
MCV: 89.9 fL (ref 79.5–101.0)
MONO#: 0.4 10*3/uL (ref 0.1–0.9)
MONO%: 7.7 % (ref 0.0–14.0)
NEUT%: 54 % (ref 38.4–76.8)
NEUTROS ABS: 2.6 10*3/uL (ref 1.5–6.5)
Platelets: 246 10*3/uL (ref 145–400)
RBC: 4.77 10*6/uL (ref 3.70–5.45)
RDW: 12.9 % (ref 11.2–14.5)
WBC: 4.7 10*3/uL (ref 3.9–10.3)
lymph#: 1.7 10*3/uL (ref 0.9–3.3)

## 2014-01-05 ENCOUNTER — Encounter: Payer: Self-pay | Admitting: Hematology

## 2014-01-05 NOTE — Progress Notes (Signed)
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 female is here because of recent diagnosis of breast ADH/ALH.  Patient went for a routine screening mammogram and showed an abnormality on 10/01/2013. Diagnostic mammogram 10/07/2013 found a 4 mm area of grouped amorphous calcifications in the lateral right breast (UOQ). She denies any breast pain. She denies any discharge from her nipple. This was biopsied using stereotactic approach and came back as atypical ductal hyperplasia and atypical lobular hyperplasia on 10/15/2013. Patient then had radioactive seed localization prior to breast surgery on 11/26/2013 using I-125 radioactive seeds at the site of clip. Dr Marlou Starks then performed a Lumpectomy on 11/27/2013 which pathology showed Atypical Lobular Hyperplasia. Inked margins negative. There was no invasive cancer found.Patient is now being referred to high risk breast clinic for discussing chemoprevention.  I reviewed her records extensively and collaborated the history with the patient.  In terms of breast cancer risk profile:  She menarched at early age of 53 and went to menopause at age 58 as she had hysterectomy She had 2 pregnancies, her first child was born at age 38  She did not breast-fed her children. She received birth control pills for approximately 16 years and also got Dep shot for fibroids finally needed a hysterectomy She was never exposed to fertility medications or hormone replacement therapy.  She has no family history of Breast/GYN/GI cancer  INTERVAL HISTORY: Currently, she is healing well after surgery apart from mild discomfort. She has no concern for local  infection  MEDICAL HISTORY:  Past Medical History  Diagnosis Date  . Asthma   . Hypertension   . Chronic kidney disease   . Wears glasses   . GERD (gastroesophageal reflux disease)   . Depression   . Anxiety   . Complication of anesthesia     cannot do GAS-hyperventulates    SURGICAL HISTORY: Past Surgical History  Procedure Laterality Date  . Vaginal hysterectomy  1995  . Wisdom tooth extraction    . Breast lumpectomy with radioactive seed localization Right 11/27/2013    Procedure: RIGHT BREAST  RADIOACTIVE SEED LOCALIZATION LUMPECTOMY;  Surgeon: Merrie Roof, MD;  Location: [facility];  Service: General;  Laterality: Right;    SOCIAL HISTORY: History   Social History  . Marital Status: Married    Spouse Name: Braydee Shimkus    Number of Children: 2  . Years of Education: N/A   Occupational History  . 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 Smoking status: Never Smoker   . Smokeless tobacco: Not on file  . Alcohol Use: Yes  . Drug Use: No  . Sexual Activity: Not on file   Other Topics Concern  . Not on file   Social History Narrative   Patient have 2 daughters [place] age 77 who is in [place], [place] and a Landscape architect. Second daughter is Nehemie Casserly age 51 also in [place], [place] and a Teacher, English as a foreign language. No smoking or Alcohol use. Patient suffers from Depression. Patient have one brother age 66 and a sister age 44 both live in [place].    FAMILY  HISTORY: Family History  Problem Relation Age of Onset  . Diabetes Mother   . Heart disease Mother   . Heart disease Father     ALLERGIES:  is allergic to augmentin and lexapro.  MEDICATIONS:  Current Outpatient Prescriptions  Medication Sig Dispense Refill  . ABILIFY 15 MG tablet Take 15 mg by mouth daily.       Marland Kitchen atenolol (TENORMIN) 25 MG tablet Take 25 mg by mouth daily.       Marland Kitchen buPROPion (WELLBUTRIN XL) 300 MG 24 hr tablet Take  300 mg by mouth daily.       . fluticasone (FLONASE) 50 MCG/ACT nasal spray Place 1 spray into both nostrils daily.       . hydrochlorothiazide (HYDRODIURIL) 25 MG tablet Take 25 mg by mouth daily.       Marland Kitchen omeprazole (PRILOSEC) 20 MG capsule Take 20 mg by mouth daily.       Marland Kitchen oxyCODONE-acetaminophen (ROXICET) 5-325 MG per tablet Take 1-2 tablets by mouth every 4 (four) hours as needed.  50 tablet  0  . potassium chloride (MICRO-K) 10 MEQ CR capsule Take 10 mEq by mouth 2 (two) times daily.       . valsartan (DIOVAN) 320 MG tablet Take 320 mg by mouth daily.       Marland Kitchen VIIBRYD 20 MG TABS Take 20 mg by mouth daily.        No current facility-administered medications for this visit.    Review of Systems  Constitutional: Positive for diaphoresis. Negative for fever, chills, weight loss and malaise/fatigue.  HENT: Negative for congestion, ear discharge, ear pain, hearing loss, nosebleeds, sore throat and tinnitus.   Eyes: Negative for blurred vision, double vision, photophobia, pain, discharge and redness.  Respiratory: Negative for cough, hemoptysis, sputum production, shortness of breath, wheezing and stridor.   Cardiovascular: Negative for chest pain, palpitations, orthopnea, claudication, leg swelling and PND.  Gastrointestinal: Negative for heartburn, nausea, vomiting, abdominal pain, diarrhea, constipation, blood in stool and melena.  Genitourinary: Negative for dysuria, urgency, frequency, hematuria and flank pain.  Musculoskeletal: Negative for back pain, falls, joint pain, myalgias and neck pain.  Skin: Negative for itching and rash.  Neurological: Negative for dizziness, tingling, tremors, sensory change, speech change, focal weakness, seizures, loss of consciousness, weakness and headaches.  Endo/Heme/Allergies: Negative for environmental allergies and polydipsia. Does not bruise/bleed easily.  Psychiatric/Behavioral: Positive for depression. Negative for suicidal ideas, hallucinations,  memory loss and substance abuse. The patient is nervous/anxious and has insomnia.      PHYSICAL EXAMINATION: ECOG PERFORMANCE STATUS: 0 KPS 100  Filed Vitals:   12/22/13 1112  BP: 130/81  Pulse: 63  Temp: 98.2 F (36.8 C)  Resp: 18   Filed Weights   12/22/13 1112  Weight: 229 lb 8 oz (104.101 kg)    Physical Exam  Constitutional: She is oriented to person, place, and time. She appears well-developed and well-nourished.  HENT:  Head: Normocephalic and atraumatic.  Right Ear: External ear normal.  Left Ear: External ear normal.  Nose: Nose normal.  Mouth/Throat: Oropharynx is clear and moist.  Eyes: EOM are normal. Pupils are equal, round, and reactive to light.  Neck: Normal range of motion. Neck supple. No JVD present. No tracheal deviation present. No thyromegaly present.  Cardiovascular: Normal rate, regular rhythm, normal heart sounds and intact distal pulses.  Exam reveals no gallop and no friction rub.   No murmur heard. Pulmonary/Chest: Effort normal and breath sounds normal. No respiratory distress.  She has no wheezes. She has no rales. Right breast exhibits no inverted nipple, no mass, no nipple discharge, no skin change and no tenderness. Left breast exhibits no inverted nipple, no mass, no nipple discharge, no skin change and no tenderness. Breasts are symmetrical. There is no breast swelling.  Abdominal: Soft. Bowel sounds are normal. She exhibits no distension and no mass. There is no tenderness.  Genitourinary: No breast bleeding.  Musculoskeletal: Normal range of motion. She exhibits no edema and no tenderness.  Lymphadenopathy:    She has no cervical adenopathy.  Neurological: She is alert and oriented to person, place, and time. She has normal reflexes. No cranial nerve deficit. She exhibits normal muscle tone. Coordination normal.  Skin: Skin is warm. No rash noted.  Psychiatric: She has a normal mood and affect. Her behavior is normal. Judgment and thought  content normal.    LABORATORY DATA:  I have reviewed the data as listed Lab Results  Component Value Date   WBC 4.7 12/22/2013   HGB 14.4 12/22/2013   HCT 42.9 12/22/2013   MCV 89.9 12/22/2013   PLT 246 12/22/2013   Lab Results  Component Value Date   NA 146* 12/22/2013   K 3.9 12/22/2013   CL 104 11/26/2013   CO2 30* 12/22/2013    RADIOGRAPHIC STUDIES: I have personally reviewed the radiological images as listed and agreed with the findings in the report.   ASSESSMENT:   1. 41 postmenopausal female with a diagnosis of ADH and ALH on breast biopsy and a Lumpectomy done on right breast 11/27/2013 showed ALH but no evidence of dysplastic changes (LCIS/DCIS) neither any invasive cancer.  2. Surgical removal of these precursor lesions is the primary therapy. There is no role for radiation therapy or chemotherapy but anti-estrogen drugs like Tamoxifen and Raloxifene are used as Selective estrogen receptor modulators and decrease the risk of invasive cancer by 50%. The absolute reduction is in order of 5-7% if we look at NSABP Prevention trials or SEER data base. With the Aromatase Inhibitors like Aromasin or recently approved Arimidex, the risk reduction is around 60-65%. They prevent the invasive cancer in same breast as well as the opposite breast.  3. These drugs can cause symptoms like hot flashes, vasomotor symptoms, vaginal itching and dryness, mood changes. Arimidex can increase cholesterol and cause HTN and promote bone loss resulting in osteopenia and osteoporosis and also cause arthralgias and myalgias. Tamoxifen can cause cataracts, gall stones, risk for dvt and pulmonary embolism and risk for uterine cancer. It also increase risk for myocardial infarction, TIA and CVA because of its prothrombotic effects. Both drugs can affect quality of life.  4. The other approach is doing surveillance by doing self breast exam monthly, a physician based breast exam every 6 month and a diagnostic  mammogram every year. Patient is agreeable to come here for a breast exam and follow up in 6 months. At that time we will make sure that we scheduled her next mammogram which would be due in June 2016 at breast center.  5. Patient have decided that she will prefer the active surveillance approach and not go on anti-estrogen drugs.  6. Time spent with patient discussing the chemoprevention strategies: 60 minutes. She was given written notes and information regarding her diagnosis and the treatment options.     Bernadene Bell, MD Medical Hematologist/Oncologist [facility] Pager: [phone] Office No: [phone]

## 2014-01-08 ENCOUNTER — Telehealth: Payer: Self-pay | Admitting: Hematology

## 2014-01-08 NOTE — Telephone Encounter (Signed)
s.w. pt and advised on Feb 2016 appt....pt ok adn aware °

## 2014-05-19 ENCOUNTER — Telehealth: Payer: Self-pay | Admitting: Hematology and Oncology

## 2014-05-19 NOTE — Telephone Encounter (Signed)
, °

## 2014-06-07 ENCOUNTER — Other Ambulatory Visit: Payer: Self-pay | Admitting: *Deleted

## 2014-06-07 DIAGNOSIS — N6091 Unspecified benign mammary dysplasia of right breast: Secondary | ICD-10-CM

## 2014-06-08 ENCOUNTER — Ambulatory Visit (HOSPITAL_BASED_OUTPATIENT_CLINIC_OR_DEPARTMENT_OTHER): Payer: BLUE CROSS/BLUE SHIELD | Admitting: Hematology and Oncology

## 2014-06-08 ENCOUNTER — Other Ambulatory Visit (HOSPITAL_BASED_OUTPATIENT_CLINIC_OR_DEPARTMENT_OTHER): Payer: BLUE CROSS/BLUE SHIELD

## 2014-06-08 ENCOUNTER — Other Ambulatory Visit: Payer: No Typology Code available for payment source

## 2014-06-08 ENCOUNTER — Telehealth: Payer: Self-pay | Admitting: Hematology and Oncology

## 2014-06-08 ENCOUNTER — Ambulatory Visit: Payer: No Typology Code available for payment source

## 2014-06-08 VITALS — BP 122/71 | HR 68 | Temp 98.0°F | Resp 19 | Ht 66.0 in | Wt 229.8 lb

## 2014-06-08 DIAGNOSIS — N62 Hypertrophy of breast: Secondary | ICD-10-CM

## 2014-06-08 DIAGNOSIS — N6091 Unspecified benign mammary dysplasia of right breast: Secondary | ICD-10-CM

## 2014-06-08 DIAGNOSIS — Z853 Personal history of malignant neoplasm of breast: Secondary | ICD-10-CM

## 2014-06-08 LAB — CBC WITH DIFFERENTIAL/PLATELET
BASO%: 0.2 % (ref 0.0–2.0)
Basophils Absolute: 0 10*3/uL (ref 0.0–0.1)
EOS%: 1.4 % (ref 0.0–7.0)
Eosinophils Absolute: 0.1 10*3/uL (ref 0.0–0.5)
HEMATOCRIT: 42.7 % (ref 34.8–46.6)
HEMOGLOBIN: 14.6 g/dL (ref 11.6–15.9)
LYMPH#: 1.7 10*3/uL (ref 0.9–3.3)
LYMPH%: 32.1 % (ref 14.0–49.7)
MCH: 30.9 pg (ref 25.1–34.0)
MCHC: 34.2 g/dL (ref 31.5–36.0)
MCV: 90.5 fL (ref 79.5–101.0)
MONO#: 0.5 10*3/uL (ref 0.1–0.9)
MONO%: 8.7 % (ref 0.0–14.0)
NEUT#: 3 10*3/uL (ref 1.5–6.5)
NEUT%: 57.6 % (ref 38.4–76.8)
PLATELETS: 243 10*3/uL (ref 145–400)
RBC: 4.72 10*6/uL (ref 3.70–5.45)
RDW: 12.8 % (ref 11.2–14.5)
WBC: 5.2 10*3/uL (ref 3.9–10.3)

## 2014-06-08 LAB — COMPREHENSIVE METABOLIC PANEL (CC13)
ALBUMIN: 3.8 g/dL (ref 3.5–5.0)
ALT: 19 U/L (ref 0–55)
ANION GAP: 12 meq/L — AB (ref 3–11)
AST: 18 U/L (ref 5–34)
Alkaline Phosphatase: 76 U/L (ref 40–150)
BUN: 20.2 mg/dL (ref 7.0–26.0)
CO2: 26 meq/L (ref 22–29)
Calcium: 9.5 mg/dL (ref 8.4–10.4)
Chloride: 107 mEq/L (ref 98–109)
Creatinine: 1.1 mg/dL (ref 0.6–1.1)
EGFR: 55 mL/min/{1.73_m2} — AB (ref >90)
GLUCOSE: 109 mg/dL (ref 70–140)
Potassium: 4 mEq/L (ref 3.5–5.1)
SODIUM: 145 meq/L (ref 136–145)
TOTAL PROTEIN: 6.9 g/dL (ref 6.4–8.3)
Total Bilirubin: 0.37 mg/dL (ref 0.20–1.20)

## 2014-06-08 NOTE — Progress Notes (Signed)
Patient Care Team: Marjorie Smolder, MD as PCP - General (Family Medicine)  DIAGNOSIS: ADH/ALH  SUMMARY OF ONCOLOGIC HISTORY: 1. Patient went for a routine screening mammogram and showed an abnormality on 10/01/2013. Diagnostic mammogram 10/07/2013 found a 4 mm area of grouped amorphous calcifications in the lateral right breast (UOQ). biopsied using stereotactic approach and came back as atypical ductal hyperplasia and atypical lobular hyperplasia on 10/15/2013.  2. Patient then had radioactive seed localization prior to breast surgery on 11/26/2013 using I-125 radioactive seeds at the site of clip. Dr Marlou Starks then performed a Lumpectomy on 11/27/2013 which pathology showed Atypical Lobular Hyperplasia  CHIEF COMPLIANT:  Follow-up for breast cancer surveillance  INTERVAL HISTORY: Linda Davidson is a  54 year old lady with above-mentioned history of ADH/ALH who is here for six-month follow-up. She was offered antiestrogen therapy with tamoxifen but she decided to go on active surveillance. She denies any new lumps or nodules in the breasts. Otherwise doing well without any new problems.  REVIEW OF SYSTEMS:   Constitutional: Denies fevers, chills or abnormal weight loss Eyes: Denies blurriness of vision Ears, nose, mouth, throat, and face: Denies mucositis or sore throat Respiratory: Denies cough, dyspnea or wheezes Cardiovascular: Denies palpitation, chest discomfort or lower extremity swelling Gastrointestinal:  Denies nausea, heartburn or change in bowel habits Skin: Denies abnormal skin rashes Lymphatics: Denies new lymphadenopathy or easy bruising Neurological:Denies numbness, tingling or new weaknesses Behavioral/Psych: Mood is stable, no new changes  Breast:  denies any pain or lumps or nodules in either breasts All other systems were reviewed with the patient and are negative.  I have reviewed the past medical history, past surgical history, social history and family history with the patient  and they are unchanged from previous note.  ALLERGIES:  is allergic to augmentin and lexapro.  MEDICATIONS:  Current Outpatient Prescriptions  Medication Sig Dispense Refill  . ABILIFY 15 MG tablet Take 15 mg by mouth daily.     Marland Kitchen atenolol (TENORMIN) 25 MG tablet Take 25 mg by mouth daily.     Marland Kitchen buPROPion (WELLBUTRIN XL) 300 MG 24 hr tablet Take 300 mg by mouth daily.     . fluticasone (FLONASE) 50 MCG/ACT nasal spray Place 1 spray into both nostrils daily.     . hydrochlorothiazide (HYDRODIURIL) 25 MG tablet Take 25 mg by mouth daily.     Marland Kitchen omeprazole (PRILOSEC) 20 MG capsule Take 20 mg by mouth daily.     Marland Kitchen oxyCODONE-acetaminophen (ROXICET) 5-325 MG per tablet Take 1-2 tablets by mouth every 4 (four) hours as needed. 50 tablet 0  . potassium chloride (MICRO-K) 10 MEQ CR capsule Take 10 mEq by mouth 2 (two) times daily.     . valsartan (DIOVAN) 320 MG tablet Take 320 mg by mouth daily.     Marland Kitchen VIIBRYD 20 MG TABS Take 20 mg by mouth daily.      No current facility-administered medications for this visit.    PHYSICAL EXAMINATION: ECOG PERFORMANCE STATUS: 0 - Asymptomatic  Filed Vitals:   06/08/14 1122  BP: 122/71  Pulse: 68  Temp: 98 F (36.7 C)  Resp: 19   Filed Weights   06/08/14 1122  Weight: 229 lb 12.8 oz (104.237 kg)    GENERAL:alert, no distress and comfortable SKIN: skin color, texture, turgor are normal, no rashes or significant lesions EYES: normal, Conjunctiva are pink and non-injected, sclera clear OROPHARYNX:no exudate, no erythema and lips, buccal mucosa, and tongue normal  NECK: supple, thyroid normal size, non-tender,  without nodularity LYMPH:  no palpable lymphadenopathy in the cervical, axillary or inguinal LUNGS: clear to auscultation and percussion with normal breathing effort HEART: regular rate & rhythm and no murmurs and no lower extremity edema ABDOMEN:abdomen soft, non-tender and normal bowel sounds Musculoskeletal:no cyanosis of digits and no  clubbing  NEURO: alert & oriented x 3 with fluent speech, no focal motor/sensory deficits BREAST: No palpable masses or nodules in either right or left breasts. No palpable axillary supraclavicular or infraclavicular adenopathy no breast tenderness or nipple discharge. (exam performed in the presence of a chaperone)  LABORATORY DATA:  I have reviewed the data as listed   Chemistry      Component Value Date/Time   NA 145 06/08/2014 1102   NA 146 11/26/2013 1030   K 4.0 06/08/2014 1102   K 4.2 11/26/2013 1030   CL 104 11/26/2013 1030   CO2 26 06/08/2014 1102   CO2 31 11/26/2013 1030   BUN 20.2 06/08/2014 1102   BUN 23 11/26/2013 1030   CREATININE 1.1 06/08/2014 1102   CREATININE 1.24* 11/26/2013 1030      Component Value Date/Time   CALCIUM 9.5 06/08/2014 1102   CALCIUM 9.7 11/26/2013 1030   ALKPHOS 76 06/08/2014 1102   AST 18 06/08/2014 1102   ALT 19 06/08/2014 1102   BILITOT 0.37 06/08/2014 1102       Lab Results  Component Value Date   WBC 5.2 06/08/2014   HGB 14.6 06/08/2014   HCT 42.7 06/08/2014   MCV 90.5 06/08/2014   PLT 243 06/08/2014   NEUTROABS 3.0 06/08/2014   ASSESSMENT & PLAN:  Atypical ductal hyperplasia of right breast ADH and ALH status post lumpectomy right breast 11/27/2013: Patient was offered tamoxifen or raloxifene but she elected to do active surveillance.  Breast cancer surveillance: 1. Mammogram to be done June 2016 2. Breast exam done 06/08/2014 is normal I recommend continuing annual surveillance for breast cancer with mammograms and breast exams once a year. Return to clinic in 1 year for follow-up  No orders of the defined types were placed in this encounter.   The patient has a good understanding of the overall plan. she agrees with it. She will call with any problems that may develop before her next visit here.   Rulon Eisenmenger, MD

## 2014-06-08 NOTE — Telephone Encounter (Signed)
, °

## 2014-06-08 NOTE — Assessment & Plan Note (Signed)
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  Breast cancer surveillance: 1. Mammogram to be done June 2016 2. Breast exam done 06/08/2014 is normal I recommend continuing annual surveillance for breast cancer with mammograms and breast exams once a year. Return to clinic in 1 year for follow-up

## 2014-09-22 ENCOUNTER — Other Ambulatory Visit: Payer: Self-pay

## 2014-09-22 DIAGNOSIS — Z1231 Encounter for screening mammogram for malignant neoplasm of breast: Secondary | ICD-10-CM

## 2014-10-20 ENCOUNTER — Ambulatory Visit
Admission: RE | Admit: 2014-10-20 | Discharge: 2014-10-20 | Disposition: A | Discharge status: Home / Self Care | Payer: BLUE CROSS/BLUE SHIELD | Source: Ambulatory Visit

## 2014-10-20 DIAGNOSIS — Z1231 Encounter for screening mammogram for malignant neoplasm of breast: Secondary | ICD-10-CM

## 2014-10-21 ENCOUNTER — Other Ambulatory Visit: Payer: Self-pay | Admitting: Family Medicine

## 2014-10-21 DIAGNOSIS — R928 Other abnormal and inconclusive findings on diagnostic imaging of breast: Secondary | ICD-10-CM

## 2014-10-27 ENCOUNTER — Ambulatory Visit
Admission: RE | Admit: 2014-10-27 | Discharge: 2014-10-27 | Disposition: A | Discharge status: Home / Self Care | Payer: BLUE CROSS/BLUE SHIELD | Source: Ambulatory Visit | Attending: Family Medicine | Admitting: Family Medicine

## 2014-10-27 DIAGNOSIS — R928 Other abnormal and inconclusive findings on diagnostic imaging of breast: Secondary | ICD-10-CM

## 2015-04-05 ENCOUNTER — Ambulatory Visit (INDEPENDENT_AMBULATORY_CARE_PROVIDER_SITE_OTHER): Payer: BLUE CROSS/BLUE SHIELD | Admitting: Neurology

## 2015-04-05 ENCOUNTER — Encounter: Payer: Self-pay | Admitting: Neurology

## 2015-04-05 VITALS — BP 100/70 | HR 88 | Ht 66.0 in | Wt 235.0 lb

## 2015-04-05 DIAGNOSIS — G2402 Drug induced acute dystonia: Secondary | ICD-10-CM | POA: Diagnosis not present

## 2015-04-05 DIAGNOSIS — G2401 Drug induced subacute dyskinesia: Secondary | ICD-10-CM

## 2015-04-05 DIAGNOSIS — T43505A Adverse effect of unspecified antipsychotics and neuroleptics, initial encounter: Principal | ICD-10-CM

## 2015-04-05 NOTE — Progress Notes (Signed)
u   Linda Davidson was seen today in the movement disorders clinic for neurologic consultation at the request of Marjorie Smolder, MD.    The patient presents neurologic consultation for abnormal movements of the hands and mouth and leg.  Her daughter states that she actually noted the movements in the beginning of November when she moved in with the patient but the patient previously lived alone and she didn't notice them.  Her daughter states that the fingers look fidgety and the tongue will protrude spontaneously.  Her daughter states that if she points out the hand movements or if she uses the hands, it does away.  I have reviewed the records made available to me.  The patient was on Abilify for mood until about one-two months ago.  She states that she was on that for several years.  She states that it was d/c because it was contributing to kidney disease.  She states that Janeth Rase is the NPwho takes care of her depression.   Specific Symptoms:  Voice: no change Sleep: sleeps well  Vivid Dreams:  No.  Acting out dreams:  No. Wet Pillows: Yes.   Postural symptoms:  No.  Falls?  No. Bradykinesia symptoms: difficulty getting out of a chair Loss of smell:  No. Loss of taste:  No. Urinary Incontinence:  No. Difficulty Swallowing:  No. Handwriting, micrographia: No. Trouble with ADL's:  No.  Trouble buttoning clothing: No. Depression:  Yes.   Memory changes:  No. Hallucinations:  No.  visual distortions: No. N/V:  No. Lightheaded:  Yes.    Syncope: No. Diplopia:  No. Dyskinesia:  No.  Neuroimaging has not recently previously been performed (thinks had done years ago).  It is not available for my review today.   ALLERGIES:   Allergies  Allergen Reactions  . Augmentin [Amoxicillin-Pot Clavulanate] Rash  . Lexapro [Escitalopram Oxalate] Rash    CURRENT MEDICATIONS:  Outpatient Encounter Prescriptions as of 04/05/2015  Medication Sig  . atenolol (TENORMIN) 25 MG tablet Take  25 mg by mouth daily.   Marland Kitchen buPROPion (WELLBUTRIN XL) 300 MG 24 hr tablet Take 300 mg by mouth daily.   . fluticasone (FLONASE) 50 MCG/ACT nasal spray Place 1 spray into both nostrils daily.   Marland Kitchen omeprazole (PRILOSEC) 20 MG capsule Take 20 mg by mouth daily.   Marland Kitchen oxyCODONE-acetaminophen (ROXICET) 5-325 MG per tablet Take 1-2 tablets by mouth every 4 (four) hours as needed.  . potassium chloride (MICRO-K) 10 MEQ CR capsule Take 10 mEq by mouth 2 (two) times daily.   . valsartan (DIOVAN) 320 MG tablet Take 320 mg by mouth daily.   Marland Kitchen VIIBRYD 20 MG TABS Take 20 mg by mouth daily.   . [DISCONTINUED] ABILIFY 15 MG tablet Take 15 mg by mouth daily.   . [DISCONTINUED] hydrochlorothiazide (HYDRODIURIL) 25 MG tablet Take 25 mg by mouth daily.    No facility-administered encounter medications on file as of 04/05/2015.    PAST MEDICAL HISTORY:   Past Medical History  Diagnosis Date  . Asthma   . Hypertension   . Chronic kidney disease   . Wears glasses   . GERD (gastroesophageal reflux disease)   . Depression   . Anxiety   . Complication of anesthesia     cannot do GAS-hyperventulates    PAST SURGICAL HISTORY:   Past Surgical History  Procedure Laterality Date  . Vaginal hysterectomy  1995  . Wisdom tooth extraction    . Breast lumpectomy with radioactive seed  localization Right 11/27/2013    Procedure: RIGHT BREAST  RADIOACTIVE SEED LOCALIZATION LUMPECTOMY;  Surgeon: Merrie Roof, MD;  Location: [place];  Service: General;  Laterality: Right;    SOCIAL HISTORY:   Social History   Social History  . Marital Status: Married    Spouse Name: Carreen Petrus  . Number of Children: 2  . Years of Education: N/A   Occupational History  . 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 Smoking status: Never Smoker   . Smokeless tobacco: Not on file  . Alcohol Use: 0.0 oz/week    0 Standard drinks or equivalent per week     Comment: one  drink a week  . Drug Use: No  . Sexual Activity: Not on file   Other Topics Concern  . Not on file   Social History Narrative   Patient have 2 daughters [place] age 34 who is in [place], [place] and a Landscape architect. Second daughter is Jayah Camano age 35 also in [place], [place] and a Teacher, English as a foreign language. No smoking or Alcohol use. Patient suffers from Depression. Patient have one brother age 9 and a sister age 7 both live in [place].    FAMILY HISTORY:   Family Status  Relation Status Death Age  . Mother Deceased     heart disease, DM  . Father Deceased     heart disease  . Sister Alive     HTN  . Brother Alive     HTN    ROS:  A complete 10 system review of systems was obtained and was unremarkable apart from what is mentioned above.  PHYSICAL EXAMINATION:    VITALS:   Filed Vitals:   04/05/15 0856  BP: 100/70  Pulse: 88  Height: 5\' 6"  (1.676 m)  Weight: 235 lb (106.595 kg)    GEN:  The patient appears stated age and is in NAD. HEENT:  Normocephalic, atraumatic.  The mucous membranes are moist. The superficial temporal arteries are without ropiness or tenderness.  The tongue does not protrude outside of the mouth.  She does mild tongue movements within the mouth. CV:  RRR Lungs:  CTAB Neck/HEME:  There are no carotid bruits bilaterally.  Neurological examination:  Orientation: The patient is alert and oriented x3. Fund of knowledge is appropriate.  Recent and remote memory are intact.  Attention and concentration are normal.    Able to name objects and repeat phrases. Cranial nerves: There is good facial symmetry.  There is mild facial hypomimia.  Pupils are equal round and reactive to light bilaterally. Fundoscopic exam reveals clear margins bilaterally. Extraocular muscles are intact. The visual fields are full to confrontational testing. The speech is fluent and clear.  She makes a soft grunting noise periodically.  Soft palate rises symmetrically  and there is no tongue deviation. Hearing is intact to conversational tone. Sensation: Sensation is intact to light and pinprick throughout (facial, trunk, extremities). Vibration is intact at the bilateral big toe although it is decreased. There is no extinction with double simultaneous stimulation. There is no sensory dermatomal level identified. Motor: Strength is 5/5 in the bilateral upper and lower extremities.   Shoulder shrug is equal and symmetric.  There is no pronator drift. Deep tendon reflexes: Deep tendon reflexes are 2-2+/4 at the bilateral biceps, triceps, brachioradialis, patella and achilles. Plantar responses are downgoing bilaterally.  Movement examination: Tone: There  is normal tone in the bilateral upper extremities.  The tone in the lower extremities is normal.  Abnormal movements: There are slight abnormal dyskinetic movements of the second, third and fourth digits on both hands.  As above, she has mild tongue movements within the mouth. Coordination:  There is no decremation with RAM's, with any form of RAMS, including alternating supination and pronation of the forearm, hand opening and closing, finger taps, heel taps and toe taps. Gait and Station: The patient has no significant difficulty arising out of a deep-seated chair without the use of the hands. The patient's stride length is normal.  She is able to ambulate in a tandem fashion.  She is able to stand in Romberg position.  The patient had lab work done on 03/27/2015.  Her hemoglobin A1c was 5.6.  Sodium was 143, potassium 4.1, chloride 106, CO2 27, BUN 29, creatinine 1.18, glucose 75.  AST was 15, ALT 18 and alkaline phosphatase 69.  ASSESSMENT/PLAN:   1.  Tardive dyskinesia  -Long discussion with the patient and her daughter.  Told the patient and her daughter that even after a medication like Abilify is discontinued, tardive dyskinesia can be permanent.  Fortunately, her tardive movements are very mild and  hopefully will resolve over the next 6 months.  It has been my experience that once movements remain for 6 months, they are more likely to be permanent.  Hopefully, hers will dissipate.  We talked about Delcie Roch, but I do not think she is a good candidate for this medication, primarily because of her depression, but also because of the mild nature of her symptoms.  I gave her the opportunity to ask questions and I answered them to the best of my ability.  At this point, I would recommend that she stay away from antipsychotics until she gets resolution of symptoms.  Hopefully, she will.  I also recommended that she stay away from any medication that could exacerbate this, such as Reglan.  We talked about this in detail.  Much greater than 50% of the 45 min visit was spent in counseling.  She asked me to send a copy of this visit to her nurse practitioner, which I am happy to do.  She sign a record release in this regard.  She will follow-up with me on an as-needed basis.

## 2015-04-05 NOTE — Progress Notes (Signed)
Note sent to Darcus Austin and Janeth Rase.

## 2015-05-16 ENCOUNTER — Telehealth: Payer: Self-pay | Admitting: Hematology and Oncology

## 2015-05-16 NOTE — Telephone Encounter (Signed)
Left message for patient regarding appointment change from 2/9 to 2/13. Appointment sent via mail.

## 2015-06-09 ENCOUNTER — Ambulatory Visit: Payer: BLUE CROSS/BLUE SHIELD | Admitting: Hematology and Oncology

## 2015-06-12 NOTE — Assessment & Plan Note (Signed)
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  Breast cancer surveillance: 1. Mammogram to be done June 2016 2. Breast exam done 06/12/15 is normal I recommend continuing annual surveillance for breast cancer with mammograms and breast exams once a year. Return to clinic in 1 year for follow-up

## 2015-06-13 ENCOUNTER — Ambulatory Visit: Payer: BLUE CROSS/BLUE SHIELD | Admitting: Hematology and Oncology

## 2015-11-25 IMAGING — MG MM RT PLC BREAST LOC DEV   1ST LESION  INC MAMMO GUIDE
7 series · 7 of 7 positions shown · non-contrast
Comparison: Previous exam(s)

CLINICAL DATA: Patient with recent diagnosis of right breast
atypical ductal hyperplasia.

EXAM:
MAMMOGRAPHIC GUIDED RADIOACTIVE SEED LOCALIZATION OF THE RIGHT
BREAST

[R ML (1 of 4)]
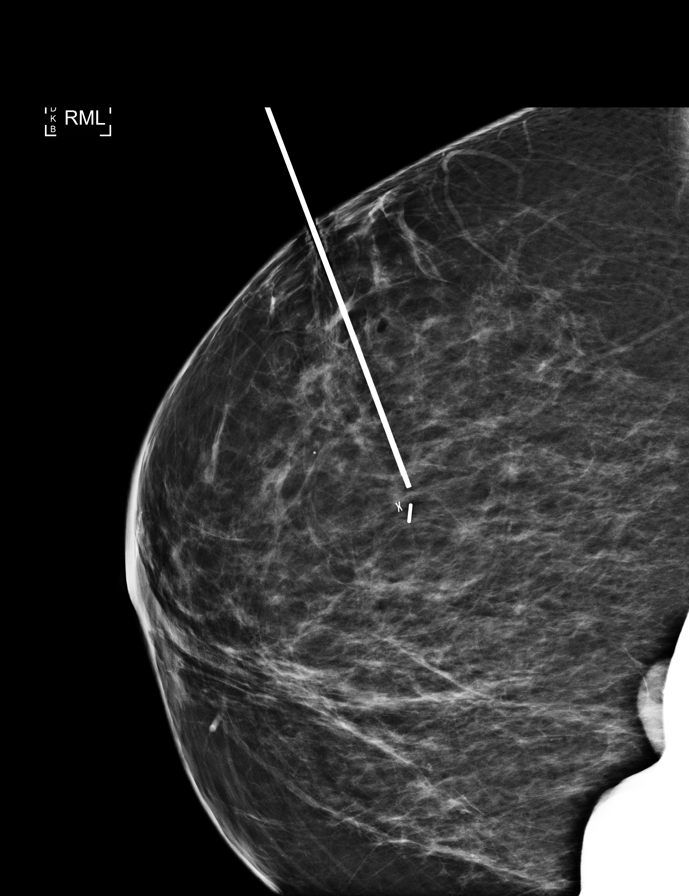

[R ML (2 of 4)]
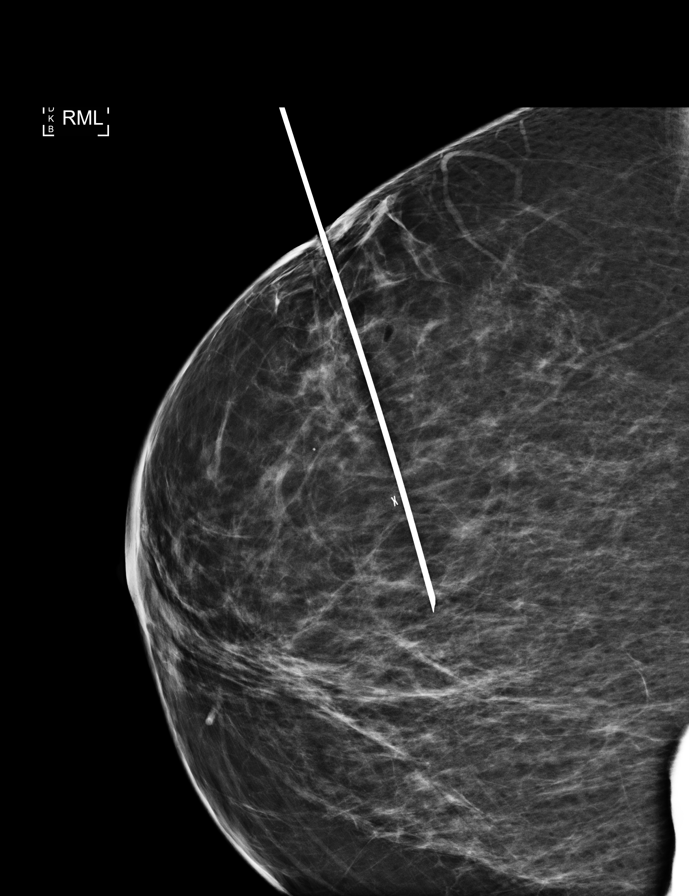

[R CC (1 of 3)]
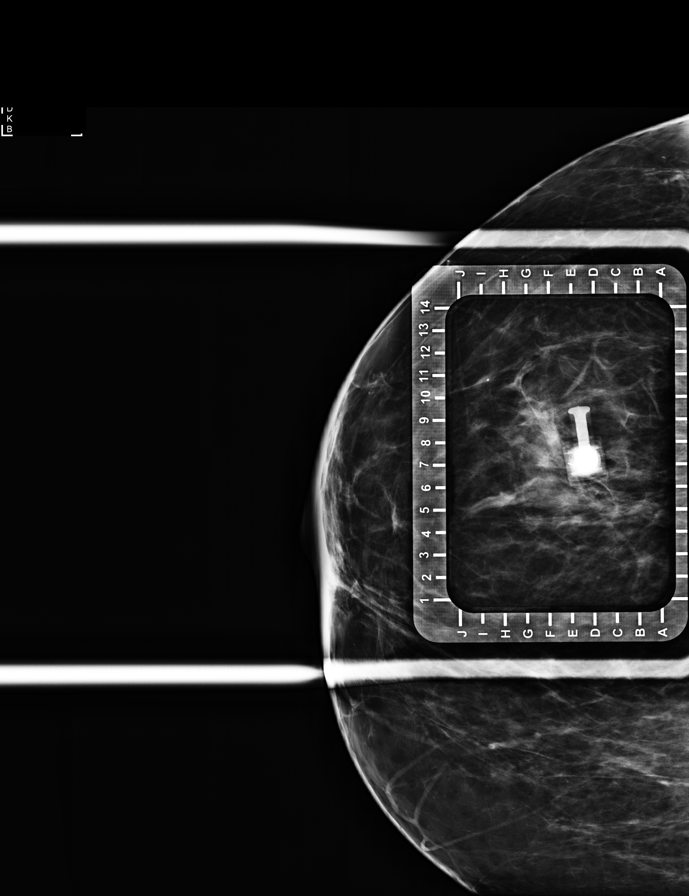

[R ML (3 of 4)]
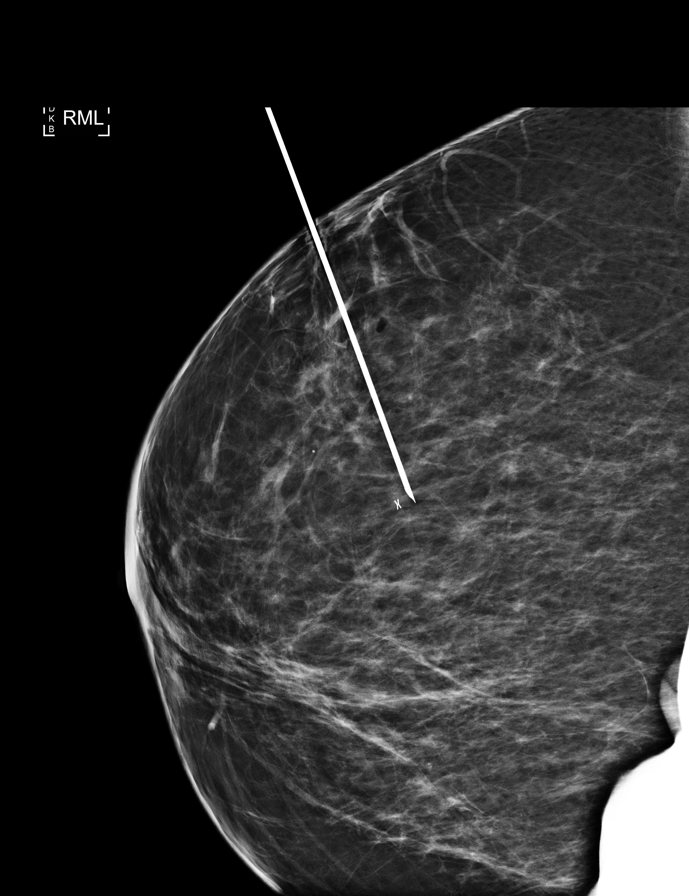

[R CC (2 of 3)]
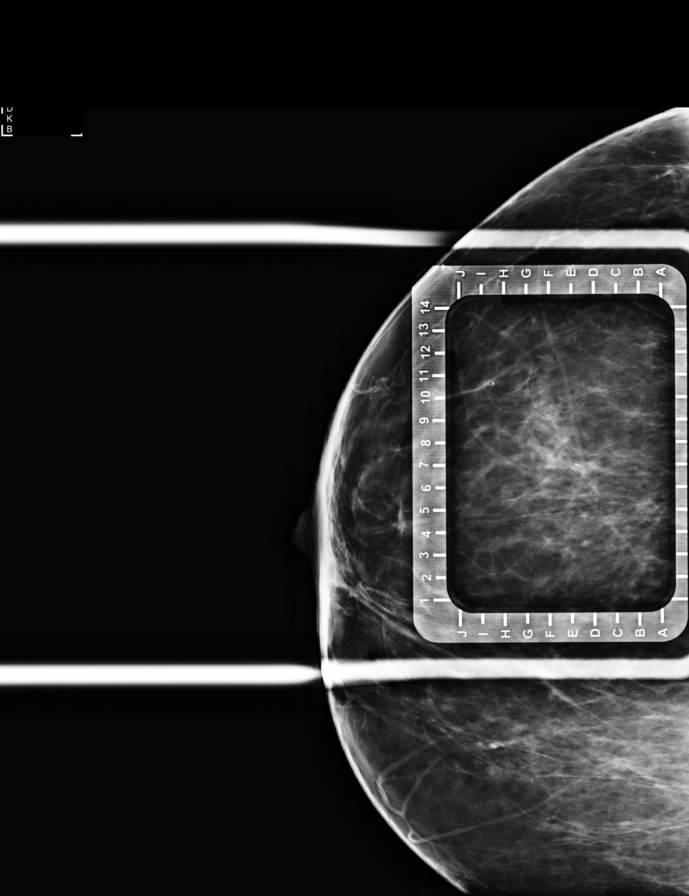

[R CC (3 of 3)]
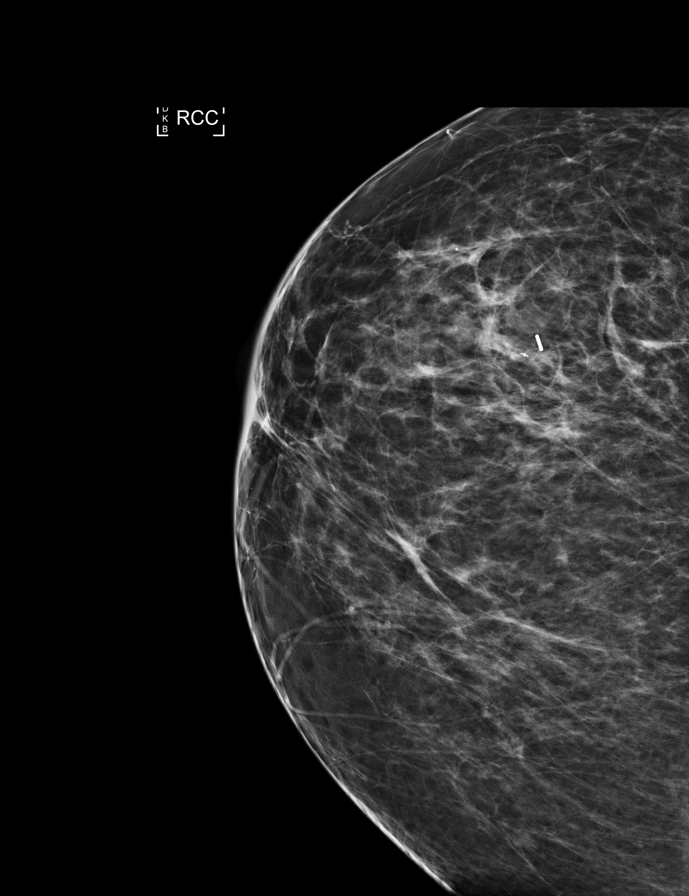

[R ML (4 of 4)]
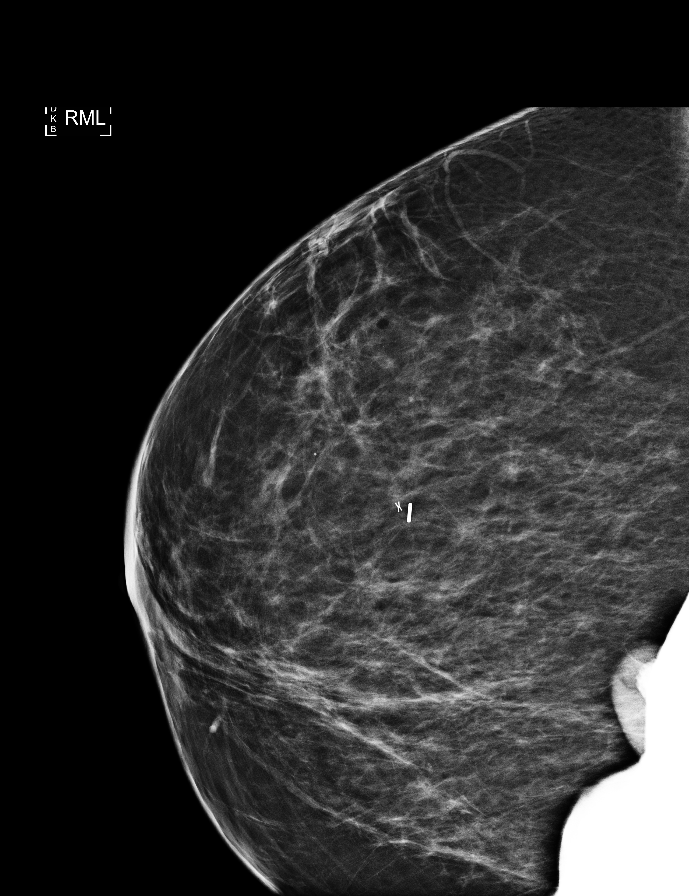

[7 of 7 positions shown; findings below may reference images not displayed]

FINDINGS: Patient presents for radioactive seed localization prior to right
breast surgical excision. I met with the patient and we discussed
the procedure of seed localization including benefits and
alternatives. We discussed the high likelihood of a successful
procedure. We discussed the risks of the procedure including
infection, bleeding, tissue injury and further surgery. We discussed
the low dose of radioactivity involved in the procedure. Informed,
written consent was given.

The usual time-out protocol was performed immediately prior to the
procedure.

Using mammographic guidance, sterile technique, 2% lidocaine and an
6-WP1 radioactive seed, biopsy marking clip within the right breast
was localized using a cranial approach. The follow-up mammogram
images confirm the seed in the expected location and are marked for
Dr. Play.

Follow-up survey of the patient confirms presence of radioactive
seed.

Order number of 6-WP1 seed:  [id number].

Dose of 6-WP1 seed:  0.251 mCi.

The patient tolerated the procedure well and was released from the
[REDACTED]. She was given instructions regarding seed removal.
IMPRESSION: Radioactive seed localization right breast. No apparent
complications.

## 2015-12-01 ENCOUNTER — Other Ambulatory Visit: Payer: Self-pay | Admitting: Family Medicine

## 2015-12-01 DIAGNOSIS — Z1231 Encounter for screening mammogram for malignant neoplasm of breast: Secondary | ICD-10-CM

## 2015-12-26 ENCOUNTER — Ambulatory Visit
Admission: RE | Admit: 2015-12-26 | Discharge: 2015-12-26 | Disposition: A | Discharge status: Home / Self Care | Payer: Commercial Managed Care - HMO | Source: Ambulatory Visit | Attending: Family Medicine | Admitting: Family Medicine

## 2015-12-26 DIAGNOSIS — Z1231 Encounter for screening mammogram for malignant neoplasm of breast: Secondary | ICD-10-CM
